# Patient Record
Sex: Male | Born: 1990 | Race: Black or African American | Hispanic: No | Marital: Single | Smoking: Current every day smoker
Health system: Southern US, Community
[De-identification: ages and names within clinical notes are randomized; demographics above are authoritative.]

---
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
## 2012-01-25 ENCOUNTER — Emergency Department (HOSPITAL_COMMUNITY)
Admission: EM | Admit: 2012-01-25 | Discharge: 2012-01-25 | Disposition: A | Discharge status: Home / Self Care | Payer: Self-pay | Attending: Emergency Medicine | Admitting: Emergency Medicine

## 2012-01-25 ENCOUNTER — Encounter (HOSPITAL_COMMUNITY): Payer: Self-pay

## 2012-01-25 DIAGNOSIS — T148XXA Other injury of unspecified body region, initial encounter: Secondary | ICD-10-CM

## 2012-01-25 DIAGNOSIS — F17200 Nicotine dependence, unspecified, uncomplicated: Secondary | ICD-10-CM | POA: Insufficient documentation

## 2012-01-25 DIAGNOSIS — M549 Dorsalgia, unspecified: Secondary | ICD-10-CM | POA: Insufficient documentation

## 2012-01-25 MED ORDER — IBUPROFEN 600 MG PO TABS: 600.0000 mg | ORAL_TABLET | Freq: Three times a day (TID) | ORAL | Status: AC | PRN

## 2012-01-25 MED ORDER — IBUPROFEN 200 MG PO TABS
600.0000 mg | ORAL_TABLET | Freq: Once | ORAL | Status: AC
  Administered 2012-01-25: 600 mg via ORAL
  Filled 2012-01-25: qty 3

## 2012-01-25 MED ORDER — METHOCARBAMOL 500 MG PO TABS: ORAL_TABLET | ORAL | Status: AC

## 2012-01-25 NOTE — ED Notes (Signed)
Pt complains of low back pain ongoing and not getting any releif, denies injury

## 2012-01-25 NOTE — ED Provider Notes (Signed)
History     CSN: [id number]  Arrival date & time 01/25/12  1438   First MD Initiated Contact with Patient 01/25/12 1711      Chief Complaint  Patient presents with  . Back Pain    (Consider location/radiation/quality/duration/timing/severity/associated sxs/prior treatment) Patient is a 21 y.o. male presenting with back pain. The history is provided by the patient.  Back Pain  Pertinent negatives include no chest pain, no fever, no numbness, no headaches, no abdominal pain and no weakness.  pt c/o upper back pain. States works for [employer], job involves bending and heavy lifting. No specific injury recalled. Pain localized to upper back, constant, non radiating. Worse w certain movements, turning torso, shoulder movements. No radicular/arm pain. No numbness/weakness. No neck pain or headache. Denies hx ddd or chronic back pain. No fever or chills.   No past medical history on file.  No past surgical history on file.  No family history on file.  History  Substance Use Topics  . Smoking status: Current Everyday Smoker  . Smokeless tobacco: Not on file  . Alcohol Use: No      Review of Systems  Constitutional: Negative for fever and chills.  HENT: Negative for neck pain.   Respiratory: Negative for cough and shortness of breath.   Cardiovascular: Negative for chest pain and leg swelling.  Gastrointestinal: Negative for abdominal pain.  Genitourinary: Negative for flank pain.  Musculoskeletal: Positive for back pain.  Skin: Negative for rash.  Neurological: Negative for weakness, numbness and headaches.  Hematological: Does not bruise/bleed easily.  Psychiatric/Behavioral: Negative for confusion.    Allergies  Bee  Home Medications   Current Outpatient Rx  Name Route Sig Dispense Refill  . IBUPROFEN 200 MG PO TABS Oral Take 400 mg by mouth every 6 (six) hours as needed. For pain      BP 112/64  Pulse 85  Temp 97.7 F (36.5 C)  Resp 16  SpO2 99%  Physical Exam    Nursing note and vitals reviewed. Constitutional: He is oriented to person, place, and time. He appears well-developed and well-nourished. No distress.  HENT:  Head: Atraumatic.  Eyes: Pupils are equal, round, and reactive to light.  Neck: Neck supple. No tracheal deviation present.  Cardiovascular: Normal rate, regular rhythm, normal heart sounds and intact distal pulses.  Exam reveals no gallop and no friction rub.   No murmur heard. Pulmonary/Chest: Effort normal and breath sounds normal. No accessory muscle usage. No respiratory distress.  Abdominal: Soft. He exhibits no distension. There is no tenderness.  Genitourinary:       No cva tenderness  Musculoskeletal: Normal range of motion. He exhibits no edema and no tenderness.       ctls spine non tender, aligned, no step off. Upper thoracic, trapezius muscular tenderness. No skin changes, erythema or sts.   Neurological: He is alert and oriented to person, place, and time.       Motor intact bil. Steady gait.   Skin: Skin is warm and dry.  Psychiatric: He has a normal mood and affect.    ED Course  Procedures (including critical care time)     MDM  Confirmed nkda w pt. Motrin po.         Suzi Roots, MD 01/25/12 1723

## 2012-01-25 NOTE — ED Notes (Signed)
Pt c/o pain in upper back and neck onset yesterday am.  St's he works at [employer] and ask to be off yesterday but would not let him.  Pt st's he thinks working made the pain worse

## 2012-01-25 NOTE — Discharge Instructions (Signed)
Take robaxin as need for muscle spasm - may make drowsy, no driving when taking. Take motrin as need for pain. May try heat/heating pad to sore area. You may try gentle massage of area. Avoid heavy lifting more than 20 lbs for the next 3 days. Follow up with primary care doctor in 1 week if symptoms fail to improve/resolve. Return to ER if worse, new symptoms, severe pain, numbness/weakness, other concern.       Muscle Strain A muscle strain, or pulled muscle, occurs when a muscle is over-stretched. A small number of muscle fibers may also be torn. This is especially common in athletes. This happens when a sudden violent force placed on a muscle pushes it past its capacity. Usually, recovery from a pulled muscle takes 1 to 2 weeks. But complete healing will take 5 to 6 weeks. There are millions of muscle fibers. Following injury, your body will usually return to normal quickly. HOME CARE INSTRUCTIONS   While awake, apply ice to the sore muscle for 15 to 20 minutes each hour for the first 2 days. Put ice in a plastic bag and place a towel between the bag of ice and your skin.   Do not use the pulled muscle for several days. Do not use the muscle if you have pain.   You may wrap the injured area with an elastic bandage for comfort. Be careful not to bind it too tightly. This may interfere with blood circulation.   Only take over-the-counter or prescription medicines for pain, discomfort, or fever as directed by your caregiver. Do not use aspirin as this will increase bleeding (bruising) at injury site.   Warming up before exercise helps prevent muscle strains.  SEEK MEDICAL CARE IF:  There is increased pain or swelling in the affected area. MAKE SURE YOU:   Understand these instructions.   Will watch your condition.   Will get help right away if you are not doing well or get worse.  Document Released: 10/26/2005 Document Revised: 10/15/2011 Document Reviewed: 05/25/2007 [facility] Patient  Information 2012 [place], [place].     Muscle Cramps Muscle cramps are due to sudden involuntary muscle contraction. This means you have no control over the tightening of a muscle (or muscles). Often there are no obvious causes. Muscle cramps may occur with overexertion. They may also occur with chilling of the muscles. An example of a muscle chilling activity is swimming. It is uncommon for cramps to be due to a serious underlying disorder. In most cases, muscle cramps improve (or leave) within minutes. CAUSES  Some common causes are:  Injury.   Infections, especially viral.   Abnormal levels of the salts and ions in your blood (electrolytes). This could happen if you are taking water pills (diuretics).   Blood vessel disease where not enough blood is getting to the muscles (intermittent claudication).  Some uncommon causes are:  Side effects of some medicine (such as lithium).   Alcohol abuse.   Diseases where there is soreness (inflammation) of the muscular system.  HOME CARE INSTRUCTIONS   It may be helpful to massage, stretch, and relax the affected muscle.   Taking a dose of over-the-counter diphenhydramine is helpful for night leg cramps.  SEEK MEDICAL CARE IF:  Cramps are frequent and not relieved with medicine. MAKE SURE YOU:   Understand these instructions.   Will watch your condition.   Will get help right away if you are not doing well or get worse.  Document Released: 04/17/2002  Document Revised: 10/15/2011 Document Reviewed: 10/17/2008 [facility] Patient Information 2012 [place], [place].    Back Pain, Adult Low back pain is very common. About 1 in 5 people have back pain.The cause of low back pain is rarely dangerous. The pain often gets better over time.About half of people with a sudden onset of back pain feel better in just 2 weeks. About 8 in 10 people feel better by 6 weeks.  CAUSES Some common causes of back pain include:  Strain of the muscles or  ligaments supporting the spine.   Wear and tear (degeneration) of the spinal discs.   Arthritis.   Direct injury to the back.  DIAGNOSIS Most of the time, the direct cause of low back pain is not known.However, back pain can be treated effectively even when the exact cause of the pain is unknown.Answering your caregiver's questions about your overall health and symptoms is one of the most accurate ways to make sure the cause of your pain is not dangerous. If your caregiver needs more information, he or she may order lab work or imaging tests (X-rays or MRIs).However, even if imaging tests show changes in your back, this usually does not require surgery. HOME CARE INSTRUCTIONS For many people, back pain returns.Since low back pain is rarely dangerous, it is often a condition that people can learn to [facility] their own.   Remain active. It is stressful on the back to sit or stand in one place. Do not sit, drive, or stand in one place for more than 30 minutes at a time. Take short walks on level surfaces as soon as pain allows.Try to increase the length of time you walk each day.   Do not stay in bed.Resting more than 1 or 2 days can delay your recovery.   Do not avoid exercise or work.Your body is made to move.It is not dangerous to be active, even though your back may hurt.Your back will likely heal faster if you return to being active before your pain is gone.   Pay attention to your body when you bend and lift. Many people have less discomfortwhen lifting if they bend their knees, keep the load close to their bodies,and avoid twisting. Often, the most comfortable positions are those that put less stress on your recovering back.   Find a comfortable position to sleep. Use a firm mattress and lie on your side with your knees slightly bent. If you lie on your back, put a pillow under your knees.   Only take over-the-counter or prescription medicines as directed by your caregiver.  Over-the-counter medicines to reduce pain and inflammation are often the most helpful.Your caregiver may prescribe muscle relaxant drugs.These medicines help dull your pain so you can more quickly return to your normal activities and healthy exercise.   Put ice on the injured area.   Put ice in a plastic bag.   Place a towel between your skin and the bag.   Leave the ice on for 15 to 20 minutes, 3 to 4 times a day for the first 2 to 3 days. After that, ice and heat may be alternated to reduce pain and spasms.   Ask your caregiver about trying back exercises and gentle massage. This may be of some benefit.   Avoid feeling anxious or stressed.Stress increases muscle tension and can worsen back pain.It is important to recognize when you are anxious or stressed and learn ways to manage it.Exercise is a great option.  SEEK MEDICAL CARE IF:  You have pain that is not relieved with rest or medicine.   You have pain that does not improve in 1 week.   You have new symptoms.   You are generally not feeling well.  SEEK IMMEDIATE MEDICAL CARE IF:   You have pain that radiates from your back into your legs.   You develop new bowel or bladder control problems.   You have unusual weakness or numbness in your arms or legs.   You develop nausea or vomiting.   You develop abdominal pain.   You feel faint.  Document Released: 10/26/2005 Document Revised: 10/15/2011 Document Reviewed: 03/16/2011 [facility] Patient Information 2012 [place], [place].

## 2013-09-21 ENCOUNTER — Encounter (HOSPITAL_COMMUNITY): Payer: Self-pay | Admitting: Emergency Medicine

## 2013-09-21 ENCOUNTER — Emergency Department (HOSPITAL_COMMUNITY)
Admission: EM | Admit: 2013-09-21 | Discharge: 2013-09-22 | Disposition: A | Discharge status: Home / Self Care | Payer: BC Managed Care – PPO | Attending: Emergency Medicine | Admitting: Emergency Medicine

## 2013-09-21 DIAGNOSIS — R319 Hematuria, unspecified: Secondary | ICD-10-CM | POA: Insufficient documentation

## 2013-09-21 DIAGNOSIS — Z202 Contact with and (suspected) exposure to infections with a predominantly sexual mode of transmission: Secondary | ICD-10-CM | POA: Insufficient documentation

## 2013-09-21 DIAGNOSIS — F17200 Nicotine dependence, unspecified, uncomplicated: Secondary | ICD-10-CM | POA: Insufficient documentation

## 2013-09-21 DIAGNOSIS — R300 Dysuria: Secondary | ICD-10-CM | POA: Insufficient documentation

## 2013-09-21 NOTE — ED Notes (Signed)
Presents with penile discharge and stinging with urination began this am. Told by a sexual partner to be checked for STDs.

## 2013-09-22 LAB — URINALYSIS, ROUTINE W REFLEX MICROSCOPIC
Glucose, UA: NEGATIVE mg/dL
Hgb urine dipstick: NEGATIVE
Ketones, ur: NEGATIVE mg/dL
Nitrite: NEGATIVE
Protein, ur: NEGATIVE mg/dL
pH: 6 (ref 5.0–8.0)

## 2013-09-22 MED ORDER — STERILE WATER FOR INJECTION IJ SOLN
INTRAMUSCULAR | Status: AC
  Filled 2013-09-22: qty 10

## 2013-09-22 MED ORDER — CEFTRIAXONE SODIUM 250 MG IJ SOLR
250.0000 mg | Freq: Once | INTRAMUSCULAR | Status: AC
  Administered 2013-09-22: 250 mg via INTRAMUSCULAR
  Filled 2013-09-22: qty 250

## 2013-09-22 MED ORDER — AZITHROMYCIN 250 MG PO TABS
1000.0000 mg | ORAL_TABLET | Freq: Once | ORAL | Status: AC
  Administered 2013-09-22: 1000 mg via ORAL
  Filled 2013-09-22: qty 4

## 2013-09-22 NOTE — ED Provider Notes (Signed)
CSN: [id number]     Arrival date & time 09/21/13  2321 History   First MD Initiated Contact with Patient 09/22/13 0006     Chief Complaint  Patient presents with  . Exposure to STD   (Consider location/radiation/quality/duration/timing/severity/associated sxs/prior Treatment) HPI Comments: He was informed by sexual partner that she tested positive for STDs.  Not sure exactly which one.  This morning.  He noticed, that he had some blood in his urine, and some discomfort at the end of his urinary stream.  Denies any testicular or abdominal pain  Patient is a 22 y.o. male presenting with STD exposure. The history is provided by the patient.  Exposure to STD This is a new problem. The current episode started in the past 7 days. The problem occurs intermittently. Associated symptoms include urinary symptoms. Pertinent negatives include no abdominal pain or fever. He has tried nothing for the symptoms. The treatment provided no relief.    History reviewed. No pertinent past medical history. History reviewed. No pertinent past surgical history. History reviewed. No pertinent family history. History  Substance Use Topics  . Smoking status: Current Every Day Smoker  . Smokeless tobacco: Not on file  . Alcohol Use: No    Review of Systems  Constitutional: Negative for fever.  Gastrointestinal: Negative for abdominal pain.  Genitourinary: Positive for dysuria and hematuria. Negative for discharge, penile swelling and penile pain.    Allergies  Bee venom and Nutritional supplements  Home Medications   Current Outpatient Rx  Name  Route  Sig  Dispense  Refill  . ibuprofen (ADVIL,MOTRIN) 200 MG tablet   Oral   Take 400 mg by mouth every 6 (six) hours as needed. For pain          BP 132/82  Pulse 97  Temp(Src) 98.3 F (36.8 C) (Oral)  Resp 16  Wt 129 lb 1 oz (58.542 kg)  SpO2 98% Physical Exam  Vitals reviewed. Constitutional: He appears well-developed and well-nourished.   HENT:  Head: Normocephalic.  Eyes: Pupils are equal, round, and reactive to light.  Neck: Normal range of motion.  Cardiovascular: Normal rate.   Pulmonary/Chest: Effort normal.  Genitourinary: No penile tenderness.  Musculoskeletal: Normal range of motion.  Neurological: He is alert.  Skin: Skin is warm.    ED Course  Procedures (including critical care time) Labs Review Labs Reviewed  URINALYSIS, ROUTINE W REFLEX MICROSCOPIC - Abnormal; Notable for the following:    Leukocytes, UA MODERATE (*)    All other components within normal limits  URINE CULTURE  GC/CHLAMYDIA PROBE AMP  URINE MICROSCOPIC-ADD ON   Imaging Review No results found.  EKG Interpretation   None       MDM   1. Exposure to STD     Patient was tested and treated for potential STD cultures have been sent    Arman Filter, NP 09/22/13 9629

## 2013-09-22 NOTE — ED Provider Notes (Signed)
Medical screening examination/treatment/procedure(s) were performed by non-physician practitioner and as supervising physician I was immediately available for consultation/collaboration.    Zriyah Kopplin M Jarryn Altland, MD 09/22/13 0629 

## 2013-09-23 LAB — GC/CHLAMYDIA PROBE AMP
CT Probe RNA: POSITIVE — AB
GC Probe RNA: POSITIVE — AB

## 2013-09-23 LAB — URINE CULTURE: Culture: NO GROWTH

## 2013-09-24 ENCOUNTER — Telehealth (HOSPITAL_COMMUNITY): Payer: Self-pay | Admitting: Emergency Medicine

## 2013-09-24 NOTE — ED Notes (Signed)
Patient has +Chlamydia & +Gonorrhea.

## 2013-09-24 NOTE — ED Notes (Signed)
+  Chlamydia. +Gonorrhea. Patient treated with Rocephin and Zithromax. [employer] faxed. 

## 2013-09-27 NOTE — ED Notes (Signed)
Unable to contact via phone letter sent to [facility] address. 

## 2015-06-01 ENCOUNTER — Encounter (HOSPITAL_COMMUNITY): Payer: Self-pay | Admitting: Emergency Medicine

## 2015-06-01 ENCOUNTER — Emergency Department (HOSPITAL_COMMUNITY)
Admission: EM | Admit: 2015-06-01 | Discharge: 2015-06-02 | Disposition: A | Discharge status: Home / Self Care | Payer: 59 | Attending: Emergency Medicine | Admitting: Emergency Medicine

## 2015-06-01 DIAGNOSIS — R369 Urethral discharge, unspecified: Secondary | ICD-10-CM | POA: Insufficient documentation

## 2015-06-01 DIAGNOSIS — Z720 Tobacco use: Secondary | ICD-10-CM | POA: Insufficient documentation

## 2015-06-01 DIAGNOSIS — R300 Dysuria: Secondary | ICD-10-CM | POA: Insufficient documentation

## 2015-06-01 DIAGNOSIS — Z202 Contact with and (suspected) exposure to infections with a predominantly sexual mode of transmission: Secondary | ICD-10-CM | POA: Diagnosis not present

## 2015-06-01 MED ORDER — AZITHROMYCIN 250 MG PO TABS
1000.0000 mg | ORAL_TABLET | Freq: Once | ORAL | Status: AC
  Administered 2015-06-02: 1000 mg via ORAL
  Filled 2015-06-01: qty 4

## 2015-06-01 MED ORDER — METRONIDAZOLE 500 MG PO TABS
2000.0000 mg | ORAL_TABLET | Freq: Once | ORAL | Status: AC
  Administered 2015-06-02: 2000 mg via ORAL
  Filled 2015-06-01: qty 4

## 2015-06-01 MED ORDER — CEFTRIAXONE SODIUM 250 MG IJ SOLR
250.0000 mg | Freq: Once | INTRAMUSCULAR | Status: AC
  Administered 2015-06-02: 250 mg via INTRAMUSCULAR
  Filled 2015-06-01: qty 250

## 2015-06-01 NOTE — ED Provider Notes (Signed)
CSN: [id number]     Arrival date & time 06/01/15  2254 History  This chart was scribed for non-physician practitioner Kerrie Buffalo, NP working with Rochele Raring, DO by Lyndel Safe, ED Scribe. This patient was seen in room TR07C/TR07C and the patient's care was started at 12:01 AM.    Chief Complaint  Patient presents with  . Exposure to STD   Patient is a 24 y.o. male presenting with STD exposure. The history is provided by the patient. No language interpreter was used.  Exposure to STD This is a new problem. The problem occurs constantly. The problem has not changed since onset.Nothing aggravates the symptoms. Nothing relieves the symptoms. He has tried nothing for the symptoms. The treatment provided no relief.   HPI Comments: Miguel Leon is a 24 y.o. male, with no pertinent PMhx, who presents to the Emergency Department complaining of sudden onset, constant, moderate dysuria and white penile discharge. Pt reports his sexual partner called him and advised him to be evaluated for an STD but did not state which STD. Denies fever.   History reviewed. No pertinent past medical history. History reviewed. No pertinent past surgical history. History reviewed. No pertinent family history. History  Substance Use Topics  . Smoking status: Current Every Day Smoker  . Smokeless tobacco: Not on file  . Alcohol Use: No    Review of Systems  Constitutional: Negative for fever.  Genitourinary: Positive for dysuria and discharge.  All other systems reviewed and are negative.  Allergies  Bee venom and Nutritional supplements  Home Medications   Prior to Admission medications   Medication Sig Start Date End Date Taking? Authorizing Provider  ibuprofen (ADVIL,MOTRIN) 200 MG tablet Take 400 mg by mouth every 6 (six) hours as needed. For pain    Historical Provider, MD   BP 130/70 mmHg  Pulse 95  Temp(Src) 98.3 F (36.8 C) (Oral)  Resp 22  Wt 132 lb 12.8 oz (60.238 kg)  SpO2  98% Physical Exam  Constitutional: He is oriented to person, place, and time. He appears well-developed and well-nourished.  HENT:  Head: Normocephalic and atraumatic.  Eyes: EOM are normal.  Neck: Neck supple.  Cardiovascular: Normal rate.   Pulmonary/Chest: Effort normal.  Abdominal: Soft. Bowel sounds are normal. There is no tenderness.  Genitourinary: Testes normal. Circumcised. Discharge found.  Musculoskeletal: Normal range of motion.  Lymphadenopathy:       Right: No inguinal adenopathy present.       Left: No inguinal adenopathy present.  Neurological: He is alert and oriented to person, place, and time. No cranial nerve deficit.  Skin: Skin is warm and dry.  Psychiatric: He has a normal mood and affect. His behavior is normal.  Nursing note and vitals reviewed.   ED Course  Procedures  DIAGNOSTIC STUDIES: Oxygen Saturation is 98% on RA, normal by my interpretation.    COORDINATION OF CARE: 12:06 AM Discussed treatment plan which includes to order STD screening with pt. Discussed with pt that STD lab results will not be available until 48 hours. Will treat for gonorrhea, chlamydia, and trichomonas. Pt acknowledges and agrees to plan.  Rocephin 250 mg. IM, Zithromax 1 gram PO, Flagyl 2 grams PO  Labs Review Labs Reviewed  RPR  HIV ANTIBODY (ROUTINE TESTING)  GC/CHLAMYDIA PROBE AMP ([place]) NOT AT [facility]    MDM  24 y.o. male with dysuria and STD exposure. Stable for d/c without fever, no difficulty voiding. Discussed with the patient clinical findings  and plan of care and all questioned fully answered. He will follow up with the health department or return here as needed for problems.  Final diagnoses:  STD exposure  Dysuria   I personally performed the services described in this documentation, which was scribed in my presence. The recorded information has been reviewed and is accurate.    [facility] Orlene Och, NP 06/02/15 2109  Layla Maw Ward, DO 06/04/15 1610

## 2015-06-01 NOTE — ED Notes (Signed)
Patient here explaining that his sexual partner told him she was diagnosed with an STD of some sort. Reports some discharge and dysuria. Denies Fever.

## 2015-06-02 LAB — RPR: RPR: NONREACTIVE

## 2015-06-02 LAB — HIV ANTIBODY (ROUTINE TESTING W REFLEX): HIV Screen 4th Generation wRfx: NONREACTIVE

## 2015-06-02 NOTE — ED Notes (Signed)
Pt left with all belongings and ambulated out of treatment area.  

## 2015-06-03 LAB — GC/CHLAMYDIA PROBE AMP (~~LOC~~) NOT AT ARMC
Chlamydia: NEGATIVE
Neisseria Gonorrhea: POSITIVE — AB

## 2015-06-08 ENCOUNTER — Telehealth: Payer: Self-pay | Admitting: Emergency Medicine

## 2015-06-09 ENCOUNTER — Telehealth (HOSPITAL_COMMUNITY): Payer: Self-pay | Admitting: Emergency Medicine

## 2015-06-09 NOTE — Telephone Encounter (Signed)
Unable to contact patient by phone regarding lab results after multiple attempts. Letter sent. 

## 2015-06-24 ENCOUNTER — Telehealth (HOSPITAL_COMMUNITY): Payer: Self-pay | Admitting: *Deleted

## 2016-02-09 ENCOUNTER — Encounter (HOSPITAL_COMMUNITY): Payer: Self-pay

## 2016-02-09 ENCOUNTER — Emergency Department (HOSPITAL_COMMUNITY)
Admission: EM | Admit: 2016-02-09 | Discharge: 2016-02-09 | Disposition: A | Discharge status: Home / Self Care | Payer: 59 | Attending: Emergency Medicine | Admitting: Emergency Medicine

## 2016-02-09 DIAGNOSIS — F17200 Nicotine dependence, unspecified, uncomplicated: Secondary | ICD-10-CM | POA: Diagnosis not present

## 2016-02-09 DIAGNOSIS — R369 Urethral discharge, unspecified: Secondary | ICD-10-CM

## 2016-02-09 LAB — URINALYSIS, ROUTINE W REFLEX MICROSCOPIC
Bilirubin Urine: NEGATIVE
GLUCOSE, UA: NEGATIVE mg/dL
HGB URINE DIPSTICK: NEGATIVE
Ketones, ur: NEGATIVE mg/dL
Nitrite: NEGATIVE
PH: 7.5 (ref 5.0–8.0)
Protein, ur: NEGATIVE mg/dL
Specific Gravity, Urine: 1.026 (ref 1.005–1.030)

## 2016-02-09 LAB — URINE MICROSCOPIC-ADD ON: RBC / HPF: NONE SEEN RBC/hpf (ref 0–5)

## 2016-02-09 LAB — RAPID HIV SCREEN (HIV 1/2 AB+AG)
HIV 1/2 ANTIBODIES: NONREACTIVE
HIV-1 P24 Antigen - HIV24: NONREACTIVE

## 2016-02-09 MED ORDER — AZITHROMYCIN 250 MG PO TABS
1000.0000 mg | ORAL_TABLET | Freq: Once | ORAL | Status: AC
  Administered 2016-02-09: 1000 mg via ORAL
  Filled 2016-02-09: qty 4

## 2016-02-09 MED ORDER — CEFTRIAXONE SODIUM 250 MG IJ SOLR
250.0000 mg | Freq: Once | INTRAMUSCULAR | Status: AC
  Administered 2016-02-09: 250 mg via INTRAMUSCULAR
  Filled 2016-02-09: qty 250

## 2016-02-09 MED ORDER — DOXYCYCLINE HYCLATE 100 MG PO CAPS: 100.0000 mg | ORAL_CAPSULE | Freq: Two times a day (BID) | ORAL | Status: AC

## 2016-02-09 MED ORDER — LIDOCAINE HCL (PF) 1 % IJ SOLN
INTRAMUSCULAR | Status: AC
  Filled 2016-02-09: qty 5

## 2016-02-09 NOTE — ED Provider Notes (Signed)
CSN: [id number]     Arrival date & time 02/09/16  2019 History   First MD Initiated Contact with Patient 02/09/16 2040     Chief Complaint  Patient presents with  . Penile Discharge     (Consider location/radiation/quality/duration/timing/severity/associated sxs/prior Treatment) HPI   25 year old male presenting with complaints of penile discharge. Patient reports he is currently sexually active with 2 separate partners within the past week. He did have a new partner and has been using a new condom. Yesterday he notice burning when he urinates and also noticed discharge coming from his penis and appeared on his boxers. He described the discharge as yellowish and green in color. No associated fever, throat pain, abdominal pain, back pain, testicular pain, increased urinary frequency or urgency, trouble with bowel movement, or rash. He reports remote history of Trichomonas.  History reviewed. No pertinent past medical history. History reviewed. No pertinent past surgical history. No family history on file. Social History  Substance Use Topics  . Smoking status: Current Every Day Smoker  . Smokeless tobacco: None  . Alcohol Use: No    Review of Systems  Constitutional: Negative for fever.  Gastrointestinal: Negative for abdominal pain.  Genitourinary: Positive for discharge. Negative for dysuria, urgency, hematuria, flank pain, scrotal swelling, genital sores, penile pain and testicular pain.  Skin: Negative for rash.      Allergies  Bee venom and Nutritional supplements  Home Medications   Prior to Admission medications   Medication Sig Start Date End Date Taking? Authorizing Provider  ibuprofen (ADVIL,MOTRIN) 200 MG tablet Take 400 mg by mouth every 6 (six) hours as needed. For pain    Historical Provider, MD   BP 109/91 mmHg  Pulse 69  Temp(Src) 98.1 F (36.7 C) (Oral)  Resp 16  SpO2 98% Physical Exam  Constitutional: He appears well-developed and well-nourished. No  distress.  HENT:  Head: Atraumatic.  Eyes: Conjunctivae are normal.  Neck: Neck supple.  Abdominal: Soft. There is no tenderness.  Genitourinary:  Chaperone present during exam. Inguinal lymphadenopathy without inguinal hernia noted. Circumcised penis with yellow discharge noted at meatus. Testicles with normal lie and nontender. Normal scrotum.  Neurological: He is alert.  Skin: No rash noted.  Psychiatric: He has a normal mood and affect.  Nursing note and vitals reviewed.   ED Course  Procedures (including critical care time) Labs Review Labs Reviewed  URINALYSIS, ROUTINE W REFLEX MICROSCOPIC (NOT AT [facility]) - Abnormal; Notable for the following:    APPearance CLOUDY (*)    Leukocytes, UA MODERATE (*)    All other components within normal limits  URINE MICROSCOPIC-ADD ON - Abnormal; Notable for the following:    Squamous Epithelial / LPF 0-5 (*)    Bacteria, UA RARE (*)    All other components within normal limits  RAPID HIV SCREEN (HIV 1/2 AB+AG)  RPR  GC/CHLAMYDIA PROBE AMP ([place]) NOT AT [facility]    Imaging Review No results found. I have personally reviewed and evaluated these images and lab results as part of my medical decision-making.   EKG Interpretation None      MDM   Final diagnoses:  Abnormal penile discharge   BP 109/91 mmHg  Pulse 69  Temp(Src) 98.1 F (36.7 C) (Oral)  Resp 16  SpO2 98%   8:53 PM Patient presents with penile discharge concern for STDs. Workup initiated. Prophylactic Rocephin and Zithromax given to cover for suspect STD.  10:15 PM Pt discharge with doxycycline.  Encourage abstinence and to  notify partner if tested positive for STD.    Fayrene Helper, PA-C 02/09/16 2216  Melene Plan, DO 02/09/16 2251

## 2016-02-09 NOTE — ED Notes (Signed)
Pt departed in NAD.  

## 2016-02-09 NOTE — Discharge Instructions (Signed)
Sexually Transmitted Disease °A sexually transmitted disease (STD) is a disease or infection that may be passed (transmitted) from person to person, usually during sexual activity. This may happen by way of saliva, semen, blood, vaginal mucus, or urine. Common STDs include: °· Gonorrhea. °· Chlamydia. °· Syphilis. °· HIV and AIDS. °· Genital herpes. °· Hepatitis B and C. °· Trichomonas. °· Human papillomavirus (HPV). °· Pubic lice. °· Scabies. °· Mites. °· Bacterial vaginosis. °WHAT ARE CAUSES OF STDs? °An STD may be caused by bacteria, a virus, or parasites. STDs are often transmitted during sexual activity if one person is infected. However, they may also be transmitted through nonsexual means. STDs may be transmitted after:  °· Sexual intercourse with an infected person. °· Sharing sex toys with an infected person. °· Sharing needles with an infected person or using unclean piercing or tattoo needles. °· Having intimate contact with the genitals, mouth, or rectal areas of an infected person. °· Exposure to infected fluids during birth. °WHAT ARE THE SIGNS AND SYMPTOMS OF STDs? °Different STDs have different symptoms. Some people may not have any symptoms. If symptoms are present, they may include: °· Painful or bloody urination. °· Pain in the pelvis, abdomen, vagina, anus, throat, or eyes. °· A skin rash, itching, or irritation. °· Growths, ulcerations, blisters, or sores in the genital and anal areas. °· Abnormal vaginal discharge with or without bad odor. °· Penile discharge in men. °· Fever. °· Pain or bleeding during sexual intercourse. °· Swollen glands in the groin area. °· Yellow skin and eyes (jaundice). This is seen with hepatitis. °· Swollen testicles. °· Infertility. °· Sores and blisters in the mouth. °HOW ARE STDs DIAGNOSED? °To make a diagnosis, your health care provider may: °· Take a medical history. °· Perform a physical exam. °· Take a sample of any discharge to examine. °· Swab the throat,  cervix, opening to the penis, rectum, or vagina for testing. °· Test a sample of your first morning urine. °· Perform blood tests. °· Perform a Pap test, if this applies. °· Perform a colposcopy. °· Perform a laparoscopy. °HOW ARE STDs TREATED? °Treatment depends on the STD. Some STDs may be treated but not cured. °· Chlamydia, gonorrhea, trichomonas, and syphilis can be cured with antibiotic medicine. °· Genital herpes, hepatitis, and HIV can be treated, but not cured, with prescribed medicines. The medicines lessen symptoms. °· Genital warts from HPV can be treated with medicine or by freezing, burning (electrocautery), or surgery. Warts may come back. °· HPV cannot be cured with medicine or surgery. However, abnormal areas may be removed from the cervix, vagina, or vulva. °· If your diagnosis is confirmed, your recent sexual partners need treatment. This is true even if they are symptom-free or have a negative culture or evaluation. They should not have sex until their health care providers say it is okay. °· Your health care provider may test you for infection again 3 months after treatment. °HOW CAN I REDUCE MY RISK OF GETTING AN STD? °Take these steps to reduce your risk of getting an STD: °· Use latex condoms, dental dams, and water-soluble lubricants during sexual activity. Do not use petroleum jelly or oils. °· Avoid having multiple sex partners. °· Do not have sex with someone who has other sex partners °· Do not have sex with anyone you do not know or who is at high risk for an STD. °· Avoid risky sex practices that can break your skin. °· Do not have sex   if you have open sores on your mouth or skin. °· Avoid drinking too much alcohol or taking illegal drugs. Alcohol and drugs can affect your judgment and put you in a vulnerable position. °· Avoid engaging in oral and anal sex acts. °· Get vaccinated for HPV and hepatitis. If you have not received these vaccines in the past, talk to your health care  provider about whether one or both might be right for you. °· If you are at risk of being infected with HIV, it is recommended that you take a prescription medicine daily to prevent HIV infection. This is called pre-exposure prophylaxis (PrEP). You are considered at risk if: °¨ You are a man who has sex with other men (MSM). °¨ You are a heterosexual man or woman and are sexually active with more than one partner. °¨ You take drugs by injection. °¨ You are sexually active with a partner who has HIV. °· Talk with your health care provider about whether you are at high risk of being infected with HIV. If you choose to begin PrEP, you should first be tested for HIV. You should then be tested every 3 months for as long as you are taking PrEP. °WHAT SHOULD I DO IF I THINK I HAVE AN STD? °· See your health care provider. °· Tell your sexual partner(s). They should be tested and treated for any STDs. °· Do not have sex until your health care provider says it is okay. °WHEN SHOULD I GET IMMEDIATE MEDICAL CARE? °Contact your health care provider right away if:  °· You have severe abdominal pain. °· You are a man and notice swelling or pain in your testicles. °· You are a woman and notice swelling or pain in your vagina. °  °This information is not intended to replace advice given to you by your health care provider. Make sure you discuss any questions you have with your health care provider. °  °Document Released: 01/16/2003 Document Revised: 11/16/2014 Document Reviewed: 05/16/2013 °Elsevier Interactive Patient Education ©2016 [employer] ° °
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
## 2016-02-10 LAB — GC/CHLAMYDIA PROBE AMP (~~LOC~~) NOT AT ARMC
CHLAMYDIA, DNA PROBE: NEGATIVE
Neisseria Gonorrhea: POSITIVE — AB

## 2016-02-10 LAB — RPR: RPR Ser Ql: NONREACTIVE

## 2016-02-11 ENCOUNTER — Telehealth (HOSPITAL_BASED_OUTPATIENT_CLINIC_OR_DEPARTMENT_OTHER): Payer: Self-pay

## 2016-02-28 ENCOUNTER — Encounter (HOSPITAL_COMMUNITY): Payer: Self-pay

## 2016-02-28 ENCOUNTER — Emergency Department (HOSPITAL_COMMUNITY)
Admission: EM | Admit: 2016-02-28 | Discharge: 2016-02-28 | Disposition: A | Discharge status: Home / Self Care | Payer: 59 | Attending: Emergency Medicine | Admitting: Emergency Medicine

## 2016-02-28 ENCOUNTER — Emergency Department (HOSPITAL_COMMUNITY): Payer: 59

## 2016-02-28 DIAGNOSIS — Y9389 Activity, other specified: Secondary | ICD-10-CM | POA: Insufficient documentation

## 2016-02-28 DIAGNOSIS — Z792 Long term (current) use of antibiotics: Secondary | ICD-10-CM | POA: Diagnosis not present

## 2016-02-28 DIAGNOSIS — F17200 Nicotine dependence, unspecified, uncomplicated: Secondary | ICD-10-CM | POA: Diagnosis not present

## 2016-02-28 DIAGNOSIS — Z79899 Other long term (current) drug therapy: Secondary | ICD-10-CM | POA: Diagnosis not present

## 2016-02-28 DIAGNOSIS — Y999 Unspecified external cause status: Secondary | ICD-10-CM | POA: Insufficient documentation

## 2016-02-28 DIAGNOSIS — S51812A Laceration without foreign body of left forearm, initial encounter: Secondary | ICD-10-CM | POA: Diagnosis not present

## 2016-02-28 DIAGNOSIS — Z23 Encounter for immunization: Secondary | ICD-10-CM | POA: Diagnosis not present

## 2016-02-28 DIAGNOSIS — S56921A Laceration of unspecified muscles, fascia and tendons at forearm level, right arm, initial encounter: Secondary | ICD-10-CM

## 2016-02-28 DIAGNOSIS — S51811A Laceration without foreign body of right forearm, initial encounter: Secondary | ICD-10-CM | POA: Diagnosis not present

## 2016-02-28 DIAGNOSIS — Y92009 Unspecified place in unspecified non-institutional (private) residence as the place of occurrence of the external cause: Secondary | ICD-10-CM | POA: Insufficient documentation

## 2016-02-28 DIAGNOSIS — S41012A Laceration without foreign body of left shoulder, initial encounter: Secondary | ICD-10-CM | POA: Insufficient documentation

## 2016-02-28 MED ORDER — SULFAMETHOXAZOLE-TRIMETHOPRIM 800-160 MG PO TABS: 1.0000 | ORAL_TABLET | Freq: Two times a day (BID) | ORAL | Status: AC

## 2016-02-28 MED ORDER — LIDOCAINE-EPINEPHRINE 2 %-1:100000 IJ SOLN: 20.0000 mL | Freq: Once | INTRAMUSCULAR | Status: DC

## 2016-02-28 MED ORDER — LIDOCAINE-EPINEPHRINE (PF) 2 %-1:200000 IJ SOLN
INTRAMUSCULAR | Status: AC
  Administered 2016-02-28: 20 mL
  Filled 2016-02-28: qty 20

## 2016-02-28 MED ORDER — TETANUS-DIPHTH-ACELL PERTUSSIS 5-2.5-18.5 LF-MCG/0.5 IM SUSP
0.5000 mL | Freq: Once | INTRAMUSCULAR | Status: AC
  Administered 2016-02-28: 0.5 mL via INTRAMUSCULAR
  Filled 2016-02-28: qty 0.5

## 2016-02-28 MED ORDER — OXYCODONE-ACETAMINOPHEN 5-325 MG PO TABS: 1.0000 | ORAL_TABLET | Freq: Three times a day (TID) | ORAL | Status: AC | PRN

## 2016-02-28 MED ORDER — CEPHALEXIN 500 MG PO CAPS: 500.0000 mg | ORAL_CAPSULE | Freq: Four times a day (QID) | ORAL | Status: AC

## 2016-02-28 NOTE — Discharge Instructions (Signed)
Laceration Care, Adult °A laceration is a cut that goes through all of the layers of the skin and into the tissue that is right under the skin. Some lacerations heal on their own. Others need to be closed with stitches (sutures), staples, skin adhesive strips, or skin glue. Proper laceration care minimizes the risk of infection and helps the laceration to heal better. °HOW TO CARE FOR YOUR LACERATION °If sutures or staples were used: °· Keep the wound clean and dry. °· If you were given a bandage (dressing), you should change it at least one time per day or as told by your health care provider. You should also change it if it becomes wet or dirty. °· Keep the wound completely dry for the first 24 hours or as told by your health care provider. After that time, you may shower or bathe. However, make sure that the wound is not soaked in water until after the sutures or staples have been removed. °· Clean the wound one time each day or as told by your health care provider: °¨ Wash the wound with soap and water. °¨ Rinse the wound with water to remove all soap. °¨ Pat the wound dry with a clean towel. Do not rub the wound. °· After cleaning the wound, apply a thin layer of antibiotic ointment as told by your health care provider. This will help to prevent infection and keep the dressing from sticking to the wound. °· Have the sutures or staples removed as told by your health care provider. °If skin adhesive strips were used: °· Keep the wound clean and dry. °· If you were given a bandage (dressing), you should change it at least one time per day or as told by your health care provider. You should also change it if it becomes dirty or wet. °· Do not get the skin adhesive strips wet. You may shower or bathe, but be careful to keep the wound dry. °· If the wound gets wet, pat it dry with a clean towel. Do not rub the wound. °· Skin adhesive strips fall off on their own. You may trim the strips as the wound heals. Do not  remove skin adhesive strips that are still stuck to the wound. They will fall off in time. °If skin glue was used: °· Try to keep the wound dry, but you may briefly wet it in the shower or bath. Do not soak the wound in water, such as by swimming. °· After you have showered or bathed, gently pat the wound dry with a clean towel. Do not rub the wound. °· Do not do any activities that will make you sweat heavily until the skin glue has fallen off on its own. °· Do not apply liquid, cream, or ointment medicine to the wound while the skin glue is in place. Using those may loosen the film before the wound has healed. °· If you were given a bandage (dressing), you should change it at least one time per day or as told by your health care provider. You should also change it if it becomes dirty or wet. °· If a dressing is placed over the wound, be careful not to apply tape directly over the skin glue. Doing that may cause the glue to be pulled off before the wound has healed. °· Do not pick at the glue. The skin glue usually remains in place for 5-10 days, then it falls off of the skin. °General Instructions °· Take over-the-counter and prescription   medicines only as told by your health care provider. °· If you were prescribed an antibiotic medicine or ointment, take or apply it as told by your doctor. Do not stop using it even if your condition improves. °· To help prevent scarring, make sure to cover your wound with sunscreen whenever you are outside after stitches are removed, after adhesive strips are removed, or when glue remains in place and the wound is healed. Make sure to wear a sunscreen of at least 30 SPF. °· Do not scratch or pick at the wound. °· Keep all follow-up visits as told by your health care provider. This is important. °· Check your wound every day for signs of infection. Watch for: °· Redness, swelling, or pain. °· Fluid, blood, or pus. °· Raise (elevate) the injured area above the level of your heart  while you are sitting or lying down, if possible. °SEEK MEDICAL CARE IF: °· You received a tetanus shot and you have swelling, severe pain, redness, or bleeding at the injection site. °· You have a fever. °· A wound that was closed breaks open. °· You notice a bad smell coming from your wound or your dressing. °· You notice something coming out of the wound, such as wood or glass. °· Your pain is not controlled with medicine. °· You have increased redness, swelling, or pain at the site of your wound. °· You have fluid, blood, or pus coming from your wound. °· You notice a change in the color of your skin near your wound. °· You need to change the dressing frequently due to fluid, blood, or pus draining from the wound. °· You develop a new rash. °· You develop numbness around the wound. °SEEK IMMEDIATE MEDICAL CARE IF: °· You develop severe swelling around the wound. °· Your pain suddenly increases and is severe. °· You develop painful lumps near the wound or on skin that is anywhere on your body. °· You have a red streak going away from your wound. °· The wound is on your hand or foot and you cannot properly move a finger or toe. °· The wound is on your hand or foot and you notice that your fingers or toes look pale or bluish. °  °This information is not intended to replace advice given to you by your health care provider. Make sure you discuss any questions you have with your health care provider. °  °Document Released: 10/26/2005 Document Revised: 03/12/2015 Document Reviewed: 10/22/2014 °Elsevier Interactive Patient Education ©2016 [employer] ° °Sutured Wound Care °Sutures are stitches that can be used to close wounds. Taking care of your wound properly can help to prevent pain and infection. It can also help your wound to heal more quickly. °HOW TO CARE FOR YOUR SUTURED WOUND °Wound Care °· Keep the wound clean and dry. °· If you were given a bandage (dressing), you should change it at least once per day or  as directed by your health care provider. You should also change it if it becomes wet or dirty. °· Keep the wound completely dry for the first 24 hours or as directed by your health care provider. After that time, you may shower or bathe. However, make sure that the wound is not soaked in water until the sutures have been removed. °· Clean the wound one time each day or as directed by your health care provider. °¨ Wash the wound with soap and water. °¨ Rinse the wound with water to remove all soap. °¨   Pat the wound dry with a clean towel. Do not rub the wound.  Aftercleaning the wound, apply a thin layer of antibioticointment as directed by your health care provider. This will help to prevent infection and keep the dressing from sticking to the wound.  Have the sutures removed as directed by your health care provider. General Instructions  Take or apply medicines only as directed by your health care provider.  To help prevent scarring, make sure to cover your wound with sunscreen whenever you are outside after the sutures are removed and the wound is healed. Make sure to wear a sunscreen of at least 30 SPF.  If you were prescribed an antibiotic medicine or ointment, finish all of it even if you start to feel better.  Do not scratch or pick at the wound.  Keep all follow-up visits as directed by your health care provider. This is important.  Check your wound every day for signs of infection. Watch for:   Redness, swelling, or pain.  Fluid, blood, or pus.  Raise (elevate) the injured area above the level of your heart while you are sitting or lying down, if possible.  Avoid stretching your wound.  Drink enough fluids to keep your urine clear or pale yellow. SEEK MEDICAL CARE IF:  You received a tetanus shot and you have swelling, severe pain, redness, or bleeding at the injection site.  You have a fever.  A wound that was closed breaks open.  You notice a bad smell coming from  the wound.  You notice something coming out of the wound, such as wood or glass.  Your pain is not controlled with medicine.  You have increased redness, swelling, or pain at the site of your wound.  You have fluid, blood, or pus coming from your wound.  You notice a change in the color of your skin near your wound.  You need to change the dressing frequently due to fluid, blood, or pus draining from the wound.  You develop a new rash.  You develop numbness around the wound. SEEK IMMEDIATE MEDICAL CARE IF:  You develop severe swelling around the injury site.  Your pain suddenly increases and is severe.  You develop painful lumps near the wound or on skin that is anywhere on your body.  You have a red streak going away from your wound.  The wound is on your hand or foot and you cannot properly move a finger or toe.  The wound is on your hand or foot and you notice that your fingers or toes look pale or bluish.   This information is not intended to replace advice given to you by your health care provider. Make sure you discuss any questions you have with your health care provider.   Document Released: 12/03/2004 Document Revised: 03/12/2015 Document Reviewed: 06/07/2013 Else[employer] Interactive Patient Education 2016 [employer]  Tendon Injury Tendons are strong, cordlike structures that connect muscle to bone. Tendons are made up of woven fibers, like a rope. A tendon injury is a tear (rupture) of the tendon. The rupture may be partial (only a few of the fibers in your tendon rupture) or complete (your entire tendon ruptures). CAUSES  Tendon injuries can be caused by high-stress activities, such as sports. They also can be caused by a repetitive injury or by a single injury from an excessive, rapid force. SYMPTOMS  Symptoms of tendon injury include pain when you move the joint close to the tendon. Other symptoms are swelling,  redness, and warmth. DIAGNOSIS  Tendon injuries  often can be diagnosed by physical exam. However, sometimes an X-ray exam or advanced imaging, such as magnetic resonance imaging (MRI), is necessary to determine the extent of the injury. TREATMENT  Partial tendon ruptures often can be treated with immobilization. A splint, bandage, or removable brace usually is used to immobilize the injured tendon. Most injured tendons need to be immobilized for 1-2 months before they are completely healed. Complete tendon ruptures may require surgical reattachment.   This information is not intended to replace advice given to you by your health care provider. Make sure you discuss any questions you have with your health care provider.   Document Released: 12/03/2004 Document Revised: 10/15/2011 Document Reviewed: 01/17/2012 [employer] Interactive Patient Education [employer]
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
## 2016-03-08 ENCOUNTER — Encounter (HOSPITAL_COMMUNITY): Payer: Self-pay | Admitting: *Deleted

## 2016-03-08 ENCOUNTER — Emergency Department (HOSPITAL_COMMUNITY)
Admission: EM | Admit: 2016-03-08 | Discharge: 2016-03-08 | Disposition: A | Discharge status: Home / Self Care | Payer: 59 | Attending: Emergency Medicine | Admitting: Emergency Medicine

## 2016-03-08 DIAGNOSIS — F17200 Nicotine dependence, unspecified, uncomplicated: Secondary | ICD-10-CM | POA: Diagnosis not present

## 2016-03-08 DIAGNOSIS — Z792 Long term (current) use of antibiotics: Secondary | ICD-10-CM | POA: Insufficient documentation

## 2016-03-08 DIAGNOSIS — Z4802 Encounter for removal of sutures: Secondary | ICD-10-CM | POA: Insufficient documentation
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
## 2016-03-08 NOTE — ED Notes (Signed)
Pt here for suture removal. Pt had stitches placed 9 days ago. Pt has sutures in left and right posterior forearm, left shoulder. Pt denies drainage from suture site. 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

## 2016-03-08 NOTE — ED Notes (Signed)
Patient was alert, oriented and stable upon discharge. RN went over AVS and patient had no further questions.  

## 2016-03-08 NOTE — Discharge Instructions (Signed)
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
## 2020-02-21 ENCOUNTER — Other Ambulatory Visit: Payer: Self-pay

## 2020-02-21 ENCOUNTER — Emergency Department (HOSPITAL_COMMUNITY)
Admission: EM | Admit: 2020-02-21 | Discharge: 2020-02-21 | Disposition: A | Discharge status: Home / Self Care | Payer: 59 | Attending: Emergency Medicine | Admitting: Emergency Medicine

## 2020-02-21 ENCOUNTER — Encounter (HOSPITAL_COMMUNITY): Payer: Self-pay

## 2020-02-21 DIAGNOSIS — F17200 Nicotine dependence, unspecified, uncomplicated: Secondary | ICD-10-CM | POA: Insufficient documentation

## 2020-02-21 DIAGNOSIS — Z202 Contact with and (suspected) exposure to infections with a predominantly sexual mode of transmission: Secondary | ICD-10-CM | POA: Insufficient documentation

## 2020-02-21 DIAGNOSIS — Z711 Person with feared health complaint in whom no diagnosis is made: Secondary | ICD-10-CM

## 2020-02-21 LAB — URINALYSIS, ROUTINE W REFLEX MICROSCOPIC
Bilirubin Urine: NEGATIVE
Glucose, UA: NEGATIVE mg/dL
Hgb urine dipstick: NEGATIVE
Ketones, ur: NEGATIVE mg/dL
Leukocytes,Ua: NEGATIVE
Nitrite: NEGATIVE
Protein, ur: NEGATIVE mg/dL
Specific Gravity, Urine: 1.018 (ref 1.005–1.030)
pH: 7 (ref 5.0–8.0)

## 2020-02-21 NOTE — Discharge Instructions (Addendum)
Return here for any changes or worsening in your condition.

## 2020-02-21 NOTE — ED Triage Notes (Signed)
Pt reports his pregnant girlfriend has a bacterial infection and pt wants to be tested, denies any symptoms.

## 2020-02-21 NOTE — ED Provider Notes (Signed)
[facility] EMERGENCY DEPARTMENT Provider Note   CSN: [id number] Arrival date & time: 02/21/20  1123     History Chief Complaint  Patient presents with  . Exposure to STD    Miguel Leon is a 29 y.o. male.  HPI Patient presents to the emergency department noting that his girlfriend is pregnant and she had gone to her OB appointment and they diagnosed her with bacterial vaginosis.  The patient states that he confirmed this diagnosis with her.  The patient states that he is currently having no symptoms of any discharge or burning or pain.  Patient states that he has not had any other sexual partner other than her.  Patient denies dysuria, nausea, vomiting, abdominal pain, fever or weakness.    History reviewed. No pertinent past medical history.  There are no problems to display for this patient.   History reviewed. No pertinent surgical history.     No family history on file.  Social History   Tobacco Use  . Smoking status: Current Every Day Smoker  Substance Use Topics  . Alcohol use: No  . Drug use: No    Frequency: 3.0 times per week    Home Medications Prior to Admission medications   Medication Sig Start Date End Date Taking? Authorizing Provider  cephALEXin (KEFLEX) 500 MG capsule Take 1 capsule (500 mg total) by mouth 4 (four) times daily. 02/28/16   Danelle Berry, PA-C  doxycycline (VIBRAMYCIN) 100 MG capsule Take 1 capsule (100 mg total) by mouth 2 (two) times daily. One po bid x 7 days Patient not taking: Reported on 02/28/2016 02/09/16   Fayrene Helper, PA-C  oxyCODONE-acetaminophen (PERCOCET) 5-325 MG tablet Take 1 tablet by mouth every 8 (eight) hours as needed for severe pain. 02/28/16   Danelle Berry, PA-C    Allergies    Bee venom and Nutritional supplements  Review of Systems   Review of Systems All other systems negative except as documented in the HPI. All pertinent positives and negatives as reviewed in the HPI. Physical  Exam Updated Vital Signs BP 138/82   Pulse (!) 54   Temp 98.2 F (36.8 C) (Oral)   Resp 16   Ht 6' (1.829 m)   Wt 68 kg   SpO2 99%   BMI 20.34 kg/m   Physical Exam Vitals and nursing note reviewed.  Constitutional:      General: He is not in acute distress.    Appearance: He is well-developed.  HENT:     Head: Normocephalic and atraumatic.  Eyes:     Pupils: Pupils are equal, round, and reactive to light.  Pulmonary:     Effort: Pulmonary effort is normal.  Genitourinary:    Penis: Normal. No discharge.   Skin:    General: Skin is warm and dry.  Neurological:     Mental Status: He is alert and oriented to person, place, and time.     ED Results / Procedures / Treatments   Labs (all labs ordered are listed, but only abnormal results are displayed) Labs Reviewed  URINALYSIS, ROUTINE W REFLEX MICROSCOPIC    EKG None  Radiology No results found.  Procedures Procedures (including critical care time)  Medications Ordered in ED Medications - No data to display  ED Course  I have reviewed the triage vital signs and the nursing notes.  Pertinent labs & imaging results that were available during my care of the patient were reviewed by me and considered in my  medical decision making (see chart for details).    MDM Rules/Calculators/A&P                      Patient was concerned that the bacterial vaginosis was an STD.  I explained to him that bacterial vaginosis and its origins in females.  The patient is advised if he has had other concerns he can return here and have [place] reevaluate him.  At this point I do not feel it is needed to treat him for any STDs. Final Clinical Impression(s) / ED Diagnoses Final diagnoses:  None    Rx / DC Orders ED Discharge Orders    None       Dalia Heading, PA-C 02/21/20 1301    Pattricia Boss, MD 02/22/20 [phone]

## 2020-02-21 NOTE — ED Notes (Signed)
Patient verbalizes understanding of discharge instructions. Opportunity for questioning and answers were provided. Armband removed by staff, pt discharged from ED.  

## 2021-03-24 ENCOUNTER — Emergency Department (HOSPITAL_COMMUNITY)
Admission: EM | Admit: 2021-03-24 | Discharge: 2021-03-24 | Discharge status: Against Medical Advice | Payer: Worker's Compensation | Attending: Emergency Medicine | Admitting: Emergency Medicine

## 2021-03-24 ENCOUNTER — Emergency Department (HOSPITAL_COMMUNITY): Payer: Worker's Compensation

## 2021-03-24 ENCOUNTER — Encounter (HOSPITAL_COMMUNITY): Payer: Self-pay | Admitting: *Deleted

## 2021-03-24 DIAGNOSIS — Z23 Encounter for immunization: Secondary | ICD-10-CM | POA: Insufficient documentation

## 2021-03-24 DIAGNOSIS — W3419XA Accidental malfunction from other specified firearms, initial encounter: Secondary | ICD-10-CM | POA: Insufficient documentation

## 2021-03-24 DIAGNOSIS — S61233A Puncture wound without foreign body of left middle finger without damage to nail, initial encounter: Secondary | ICD-10-CM | POA: Diagnosis not present

## 2021-03-24 DIAGNOSIS — F17200 Nicotine dependence, unspecified, uncomplicated: Secondary | ICD-10-CM | POA: Insufficient documentation

## 2021-03-24 DIAGNOSIS — Z5321 Procedure and treatment not carried out due to patient leaving prior to being seen by health care provider: Secondary | ICD-10-CM | POA: Diagnosis not present

## 2021-03-24 DIAGNOSIS — S6992XA Unspecified injury of left wrist, hand and finger(s), initial encounter: Secondary | ICD-10-CM | POA: Diagnosis present

## 2021-03-24 MED ORDER — IBUPROFEN 800 MG PO TABS
800.0000 mg | ORAL_TABLET | Freq: Once | ORAL | Status: AC
  Administered 2021-03-24: 800 mg via ORAL
  Filled 2021-03-24: qty 1

## 2021-03-24 MED ORDER — TETANUS-DIPHTH-ACELL PERTUSSIS 5-2.5-18.5 LF-MCG/0.5 IM SUSY
0.5000 mL | PREFILLED_SYRINGE | Freq: Once | INTRAMUSCULAR | Status: AC
  Administered 2021-03-24: 0.5 mL via INTRAMUSCULAR
  Filled 2021-03-24 (×2): qty 0.5
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
## 2021-03-24 NOTE — ED Notes (Signed)
Patient left AMA to pick his daughter up. He was educated on the need for an xray, but he was unable to stay to complete it. PA made aware.

## 2021-04-01 ENCOUNTER — Other Ambulatory Visit: Payer: Self-pay

## 2021-04-01 ENCOUNTER — Ambulatory Visit (HOSPITAL_COMMUNITY)
Admission: EM | Admit: 2021-04-01 | Discharge: 2021-04-01 | Disposition: A | Discharge status: Home / Self Care | Payer: Worker's Compensation

## 2021-04-01 ENCOUNTER — Encounter (HOSPITAL_COMMUNITY): Payer: Self-pay | Admitting: Emergency Medicine

## 2021-04-01 DIAGNOSIS — S6992XD Unspecified injury of left wrist, hand and finger(s), subsequent encounter: Secondary | ICD-10-CM
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
## 2021-04-01 NOTE — ED Provider Notes (Signed)
[facility]    CSN: [id number] Arrival date & time: 04/01/21  1729      History   Chief Complaint Chief Complaint  Patient presents with  . Finger Injury    HPI Miguel Leon is a 30 y.o. male.   Patient here for re-evaluation after getting injured at work earlier this month.  Left middle finger with a healing wound but no signs of infection.  Denies any pain or decreased ROM.  Denies any specific alleviating or aggravating factors.  Denies any fevers, chest pain, shortness of breath, N/V/D, numbness, tingling, weakness, abdominal pain, or headaches.    The history is provided by the patient.    History reviewed. No pertinent past medical history.  There are no problems to display for this patient.   History reviewed. No pertinent surgical history.     Home Medications    Prior to Admission medications   Medication Sig Start Date End Date Taking? Authorizing Provider  cephALEXin (KEFLEX) 500 MG capsule Take 1 capsule (500 mg total) by mouth 4 (four) times daily. 02/28/16   Danelle Berry, PA-C  doxycycline (VIBRAMYCIN) 100 MG capsule Take 1 capsule (100 mg total) by mouth 2 (two) times daily. One po bid x 7 days Patient not taking: Reported on 02/28/2016 02/09/16   Fayrene Helper, PA-C  oxyCODONE-acetaminophen (PERCOCET) 5-325 MG tablet Take 1 tablet by mouth every 8 (eight) hours as needed for severe pain. 02/28/16   Danelle Berry, PA-C    Family History History reviewed. No pertinent family history.  Social History Social History   Tobacco Use  . Smoking status: Current Every Day Smoker  . Smokeless tobacco: Never Used  Substance Use Topics  . Alcohol use: No  . Drug use: No    Frequency: 3.0 times per week     Allergies   Bee venom and Nutritional supplements   Review of Systems Review of Systems  All other systems reviewed and are negative.    Physical Exam Triage Vital Signs ED Triage Vitals  Enc Vitals Group     BP 04/01/21  1747 134/76     Pulse Rate 04/01/21 1747 (!) 59     Resp 04/01/21 1747 16     Temp 04/01/21 1747 98.2 F (36.8 C)     Temp Source 04/01/21 1747 Oral     SpO2 04/01/21 1747 98 %     Weight --      Height --      Head Circumference --      Peak Flow --      Pain Score 04/01/21 1745 0     Pain Loc --      Pain Edu? --      Excl. in GC? --    No data found.  Updated Vital Signs BP 134/76 (BP Location: Left Arm)   Pulse (!) 59   Temp 98.2 F (36.8 C) (Oral)   Resp 16   SpO2 98%   Visual Acuity Right Eye Distance:   Left Eye Distance:   Bilateral Distance:    Right Eye Near:   Left Eye Near:    Bilateral Near:     Physical Exam Vitals and nursing note reviewed.  Constitutional:      General: He is not in acute distress.    Appearance: Normal appearance. He is not ill-appearing, toxic-appearing or diaphoretic.  HENT:     Head: Normocephalic and atraumatic.  Eyes:     Conjunctiva/sclera: Conjunctivae normal.  Cardiovascular:     Rate and Rhythm: Normal rate.     Pulses: Normal pulses.  Pulmonary:     Effort: Pulmonary effort is normal.  Abdominal:     General: Abdomen is flat.  Musculoskeletal:        General: Normal range of motion.     Right hand: Normal.     Cervical back: Normal range of motion.     Comments: Left middle finger with healing wound.  No decreased sensation or pain with palpation.  No decreased ROM.    Skin:    General: Skin is warm and dry.  Neurological:     General: No focal deficit present.     Mental Status: He is alert and oriented to person, place, and time.  Psychiatric:        Mood and Affect: Mood normal.      UC Treatments / Results  Labs (all labs ordered are listed, but only abnormal results are displayed) Labs Reviewed - No data to display  EKG   Radiology No results found.  Procedures Procedures (including critical care time)  Medications Ordered in UC Medications - No data to display  Initial Impression /  Assessment and Plan / UC Course  I have reviewed the triage vital signs and the nursing notes.  Pertinent labs & imaging results that were available during my care of the patient were reviewed by me and considered in my medical decision making (see chart for details).    Assessment negative for red flags or concerns.  Patient is able to return to work.  Final Clinical Impressions(s) / UC Diagnoses   Final diagnoses:  Injury of finger of left hand, subsequent encounter     Discharge Instructions     You can return to work.    Return or go to the Emergency Department if symptoms worsen or do not improve in the next few days.      ED Prescriptions    None     PDMP not reviewed this encounter.   Ivette Loyal, NP 04/01/21 1806

## 2021-04-01 NOTE — Discharge Instructions (Signed)
You can return to work.    Return or go to the Emergency Department if symptoms worsen or do not improve in the next few days.
